# Patient Record
Sex: Male | Born: 1965 | Race: White | Marital: Married
Health system: Southern US, Community
[De-identification: ages and names within clinical notes are randomized; demographics above are authoritative.]

---

## 2013-09-26 ENCOUNTER — Ambulatory Visit (HOSPITAL_COMMUNITY)
Admission: RE | Admit: 2013-09-26 | Discharge: 2013-09-26 | Disposition: A | Discharge status: Home / Self Care | Payer: BC Managed Care – PPO | Source: Ambulatory Visit | Attending: Orthopedic Surgery | Admitting: Orthopedic Surgery

## 2013-09-26 ENCOUNTER — Other Ambulatory Visit (HOSPITAL_COMMUNITY): Payer: Self-pay | Admitting: Orthopedic Surgery

## 2013-09-26 DIAGNOSIS — Z139 Encounter for screening, unspecified: Secondary | ICD-10-CM

## 2013-09-26 DIAGNOSIS — Z01818 Encounter for other preprocedural examination: Secondary | ICD-10-CM | POA: Insufficient documentation

## 2014-02-02 ENCOUNTER — Encounter: Payer: Self-pay | Admitting: *Deleted

## 2014-02-02 NOTE — Telephone Encounter (Signed)
Opened in errror 

## 2015-10-02 DIAGNOSIS — R7309 Other abnormal glucose: Secondary | ICD-10-CM | POA: Diagnosis not present

## 2015-10-02 DIAGNOSIS — Z23 Encounter for immunization: Secondary | ICD-10-CM | POA: Diagnosis not present

## 2015-10-02 DIAGNOSIS — Z6828 Body mass index (BMI) 28.0-28.9, adult: Secondary | ICD-10-CM | POA: Diagnosis not present

## 2015-10-02 DIAGNOSIS — E291 Testicular hypofunction: Secondary | ICD-10-CM | POA: Diagnosis not present

## 2015-10-02 DIAGNOSIS — Z1389 Encounter for screening for other disorder: Secondary | ICD-10-CM | POA: Diagnosis not present

## 2015-10-02 DIAGNOSIS — R221 Localized swelling, mass and lump, neck: Secondary | ICD-10-CM | POA: Diagnosis not present

## 2015-10-17 DIAGNOSIS — D170 Benign lipomatous neoplasm of skin and subcutaneous tissue of head, face and neck: Secondary | ICD-10-CM | POA: Diagnosis not present

## 2016-02-28 DIAGNOSIS — Z6827 Body mass index (BMI) 27.0-27.9, adult: Secondary | ICD-10-CM | POA: Diagnosis not present

## 2016-02-28 DIAGNOSIS — Z1389 Encounter for screening for other disorder: Secondary | ICD-10-CM | POA: Diagnosis not present

## 2016-02-28 DIAGNOSIS — E291 Testicular hypofunction: Secondary | ICD-10-CM | POA: Diagnosis not present

## 2016-02-28 DIAGNOSIS — Z0000 Encounter for general adult medical examination without abnormal findings: Secondary | ICD-10-CM | POA: Diagnosis not present

## 2016-02-28 DIAGNOSIS — R7309 Other abnormal glucose: Secondary | ICD-10-CM | POA: Diagnosis not present

## 2016-06-11 DIAGNOSIS — H5203 Hypermetropia, bilateral: Secondary | ICD-10-CM | POA: Diagnosis not present

## 2016-08-20 DIAGNOSIS — R351 Nocturia: Secondary | ICD-10-CM | POA: Diagnosis not present

## 2016-08-20 DIAGNOSIS — R350 Frequency of micturition: Secondary | ICD-10-CM | POA: Diagnosis not present

## 2016-08-20 DIAGNOSIS — R3915 Urgency of urination: Secondary | ICD-10-CM | POA: Diagnosis not present

## 2016-08-20 DIAGNOSIS — R3129 Other microscopic hematuria: Secondary | ICD-10-CM | POA: Diagnosis not present

## 2016-08-20 DIAGNOSIS — Z87448 Personal history of other diseases of urinary system: Secondary | ICD-10-CM | POA: Diagnosis not present

## 2016-08-22 DIAGNOSIS — R3129 Other microscopic hematuria: Secondary | ICD-10-CM | POA: Diagnosis not present

## 2016-08-22 DIAGNOSIS — N401 Enlarged prostate with lower urinary tract symptoms: Secondary | ICD-10-CM | POA: Diagnosis not present

## 2016-08-22 DIAGNOSIS — R351 Nocturia: Secondary | ICD-10-CM | POA: Diagnosis not present

## 2016-08-22 DIAGNOSIS — N202 Calculus of kidney with calculus of ureter: Secondary | ICD-10-CM | POA: Diagnosis not present

## 2016-08-22 DIAGNOSIS — Z87438 Personal history of other diseases of male genital organs: Secondary | ICD-10-CM | POA: Diagnosis not present

## 2016-08-22 DIAGNOSIS — Z87442 Personal history of urinary calculi: Secondary | ICD-10-CM | POA: Diagnosis not present

## 2016-08-22 DIAGNOSIS — R3915 Urgency of urination: Secondary | ICD-10-CM | POA: Diagnosis not present

## 2016-08-22 DIAGNOSIS — Z87448 Personal history of other diseases of urinary system: Secondary | ICD-10-CM | POA: Diagnosis not present

## 2016-08-22 DIAGNOSIS — R350 Frequency of micturition: Secondary | ICD-10-CM | POA: Diagnosis not present

## 2016-08-22 DIAGNOSIS — N281 Cyst of kidney, acquired: Secondary | ICD-10-CM | POA: Diagnosis not present

## 2016-08-25 DIAGNOSIS — R300 Dysuria: Secondary | ICD-10-CM | POA: Diagnosis not present

## 2016-08-25 DIAGNOSIS — R3915 Urgency of urination: Secondary | ICD-10-CM | POA: Diagnosis not present

## 2016-08-25 DIAGNOSIS — N201 Calculus of ureter: Secondary | ICD-10-CM | POA: Diagnosis not present

## 2016-08-25 DIAGNOSIS — R3911 Hesitancy of micturition: Secondary | ICD-10-CM | POA: Diagnosis not present

## 2016-09-04 DIAGNOSIS — N401 Enlarged prostate with lower urinary tract symptoms: Secondary | ICD-10-CM | POA: Diagnosis not present

## 2016-09-04 DIAGNOSIS — N200 Calculus of kidney: Secondary | ICD-10-CM | POA: Diagnosis not present

## 2016-09-04 DIAGNOSIS — N201 Calculus of ureter: Secondary | ICD-10-CM | POA: Diagnosis not present

## 2016-10-29 DIAGNOSIS — N201 Calculus of ureter: Secondary | ICD-10-CM | POA: Diagnosis not present

## 2016-11-07 DIAGNOSIS — N2889 Other specified disorders of kidney and ureter: Secondary | ICD-10-CM | POA: Diagnosis not present

## 2016-11-07 DIAGNOSIS — N201 Calculus of ureter: Secondary | ICD-10-CM | POA: Diagnosis not present

## 2016-11-12 DIAGNOSIS — N200 Calculus of kidney: Secondary | ICD-10-CM | POA: Diagnosis not present

## 2016-11-12 DIAGNOSIS — N401 Enlarged prostate with lower urinary tract symptoms: Secondary | ICD-10-CM | POA: Diagnosis not present

## 2016-11-12 DIAGNOSIS — N201 Calculus of ureter: Secondary | ICD-10-CM | POA: Diagnosis not present

## 2016-11-13 DIAGNOSIS — N201 Calculus of ureter: Secondary | ICD-10-CM | POA: Diagnosis not present

## 2016-11-18 DIAGNOSIS — N201 Calculus of ureter: Secondary | ICD-10-CM | POA: Diagnosis not present

## 2016-11-18 DIAGNOSIS — N410 Acute prostatitis: Secondary | ICD-10-CM | POA: Diagnosis not present

## 2016-11-18 DIAGNOSIS — N419 Inflammatory disease of prostate, unspecified: Secondary | ICD-10-CM | POA: Diagnosis not present

## 2016-11-27 DIAGNOSIS — M9903 Segmental and somatic dysfunction of lumbar region: Secondary | ICD-10-CM | POA: Diagnosis not present

## 2016-11-27 DIAGNOSIS — M9904 Segmental and somatic dysfunction of sacral region: Secondary | ICD-10-CM | POA: Diagnosis not present

## 2016-11-27 DIAGNOSIS — M9905 Segmental and somatic dysfunction of pelvic region: Secondary | ICD-10-CM | POA: Diagnosis not present

## 2016-11-27 DIAGNOSIS — M5432 Sciatica, left side: Secondary | ICD-10-CM | POA: Diagnosis not present

## 2016-12-01 DIAGNOSIS — M5430 Sciatica, unspecified side: Secondary | ICD-10-CM | POA: Diagnosis not present

## 2016-12-02 DIAGNOSIS — N281 Cyst of kidney, acquired: Secondary | ICD-10-CM | POA: Diagnosis not present

## 2016-12-02 DIAGNOSIS — M5430 Sciatica, unspecified side: Secondary | ICD-10-CM | POA: Diagnosis not present

## 2016-12-02 DIAGNOSIS — M79605 Pain in left leg: Secondary | ICD-10-CM | POA: Diagnosis not present

## 2016-12-04 DIAGNOSIS — G5702 Lesion of sciatic nerve, left lower limb: Secondary | ICD-10-CM | POA: Diagnosis not present

## 2016-12-04 DIAGNOSIS — E663 Overweight: Secondary | ICD-10-CM | POA: Diagnosis not present

## 2016-12-04 DIAGNOSIS — Z6827 Body mass index (BMI) 27.0-27.9, adult: Secondary | ICD-10-CM | POA: Diagnosis not present

## 2016-12-16 DIAGNOSIS — M79672 Pain in left foot: Secondary | ICD-10-CM | POA: Diagnosis not present

## 2016-12-16 DIAGNOSIS — M5410 Radiculopathy, site unspecified: Secondary | ICD-10-CM | POA: Diagnosis not present

## 2016-12-16 DIAGNOSIS — M5432 Sciatica, left side: Secondary | ICD-10-CM | POA: Diagnosis not present

## 2016-12-16 DIAGNOSIS — M79609 Pain in unspecified limb: Secondary | ICD-10-CM | POA: Diagnosis not present

## 2016-12-18 DIAGNOSIS — M5416 Radiculopathy, lumbar region: Secondary | ICD-10-CM | POA: Diagnosis not present

## 2016-12-22 DIAGNOSIS — M5416 Radiculopathy, lumbar region: Secondary | ICD-10-CM | POA: Diagnosis not present

## 2016-12-29 DIAGNOSIS — M5417 Radiculopathy, lumbosacral region: Secondary | ICD-10-CM | POA: Diagnosis not present

## 2017-01-14 DIAGNOSIS — M5417 Radiculopathy, lumbosacral region: Secondary | ICD-10-CM | POA: Diagnosis not present

## 2017-01-20 DIAGNOSIS — M5417 Radiculopathy, lumbosacral region: Secondary | ICD-10-CM | POA: Diagnosis not present

## 2017-02-11 DIAGNOSIS — H5203 Hypermetropia, bilateral: Secondary | ICD-10-CM | POA: Diagnosis not present

## 2017-02-17 DIAGNOSIS — M5417 Radiculopathy, lumbosacral region: Secondary | ICD-10-CM | POA: Diagnosis not present

## 2017-02-19 DIAGNOSIS — M5417 Radiculopathy, lumbosacral region: Secondary | ICD-10-CM | POA: Diagnosis not present

## 2017-02-26 DIAGNOSIS — M5417 Radiculopathy, lumbosacral region: Secondary | ICD-10-CM | POA: Diagnosis not present

## 2017-03-03 DIAGNOSIS — M5417 Radiculopathy, lumbosacral region: Secondary | ICD-10-CM | POA: Diagnosis not present

## 2017-03-05 DIAGNOSIS — M5417 Radiculopathy, lumbosacral region: Secondary | ICD-10-CM | POA: Diagnosis not present

## 2017-03-09 DIAGNOSIS — L821 Other seborrheic keratosis: Secondary | ICD-10-CM | POA: Diagnosis not present

## 2017-03-10 DIAGNOSIS — M5417 Radiculopathy, lumbosacral region: Secondary | ICD-10-CM | POA: Diagnosis not present

## 2017-03-16 DIAGNOSIS — M5417 Radiculopathy, lumbosacral region: Secondary | ICD-10-CM | POA: Diagnosis not present

## 2017-03-19 DIAGNOSIS — M5417 Radiculopathy, lumbosacral region: Secondary | ICD-10-CM | POA: Diagnosis not present

## 2017-03-19 DIAGNOSIS — D170 Benign lipomatous neoplasm of skin and subcutaneous tissue of head, face and neck: Secondary | ICD-10-CM | POA: Diagnosis not present

## 2017-03-20 DIAGNOSIS — M5417 Radiculopathy, lumbosacral region: Secondary | ICD-10-CM | POA: Diagnosis not present

## 2017-03-23 DIAGNOSIS — M5417 Radiculopathy, lumbosacral region: Secondary | ICD-10-CM | POA: Diagnosis not present

## 2017-03-25 DIAGNOSIS — M5417 Radiculopathy, lumbosacral region: Secondary | ICD-10-CM | POA: Diagnosis not present

## 2017-03-27 DIAGNOSIS — M5417 Radiculopathy, lumbosacral region: Secondary | ICD-10-CM | POA: Diagnosis not present

## 2017-05-06 DIAGNOSIS — D170 Benign lipomatous neoplasm of skin and subcutaneous tissue of head, face and neck: Secondary | ICD-10-CM | POA: Diagnosis not present

## 2017-05-29 DIAGNOSIS — D170 Benign lipomatous neoplasm of skin and subcutaneous tissue of head, face and neck: Secondary | ICD-10-CM | POA: Diagnosis not present

## 2017-08-19 DIAGNOSIS — M5417 Radiculopathy, lumbosacral region: Secondary | ICD-10-CM | POA: Diagnosis not present

## 2017-12-02 DIAGNOSIS — G5700 Lesion of sciatic nerve, unspecified lower limb: Secondary | ICD-10-CM | POA: Diagnosis not present

## 2018-01-04 DIAGNOSIS — H16002 Unspecified corneal ulcer, left eye: Secondary | ICD-10-CM | POA: Diagnosis not present

## 2018-01-08 DIAGNOSIS — H16002 Unspecified corneal ulcer, left eye: Secondary | ICD-10-CM | POA: Diagnosis not present

## 2018-01-13 DIAGNOSIS — H16002 Unspecified corneal ulcer, left eye: Secondary | ICD-10-CM | POA: Diagnosis not present

## 2018-02-18 DIAGNOSIS — Z1389 Encounter for screening for other disorder: Secondary | ICD-10-CM | POA: Diagnosis not present

## 2018-02-18 DIAGNOSIS — Z0000 Encounter for general adult medical examination without abnormal findings: Secondary | ICD-10-CM | POA: Diagnosis not present

## 2018-02-18 DIAGNOSIS — N419 Inflammatory disease of prostate, unspecified: Secondary | ICD-10-CM | POA: Diagnosis not present

## 2018-02-18 DIAGNOSIS — E663 Overweight: Secondary | ICD-10-CM | POA: Diagnosis not present

## 2018-02-18 DIAGNOSIS — R7301 Impaired fasting glucose: Secondary | ICD-10-CM | POA: Diagnosis not present

## 2018-02-18 DIAGNOSIS — E291 Testicular hypofunction: Secondary | ICD-10-CM | POA: Diagnosis not present

## 2018-02-18 DIAGNOSIS — Z6827 Body mass index (BMI) 27.0-27.9, adult: Secondary | ICD-10-CM | POA: Diagnosis not present

## 2018-08-04 DIAGNOSIS — H524 Presbyopia: Secondary | ICD-10-CM | POA: Diagnosis not present

## 2019-08-11 DIAGNOSIS — Z6827 Body mass index (BMI) 27.0-27.9, adult: Secondary | ICD-10-CM | POA: Diagnosis not present

## 2019-08-11 DIAGNOSIS — Z0001 Encounter for general adult medical examination with abnormal findings: Secondary | ICD-10-CM | POA: Diagnosis not present

## 2019-08-11 DIAGNOSIS — R7301 Impaired fasting glucose: Secondary | ICD-10-CM | POA: Diagnosis not present

## 2019-08-11 DIAGNOSIS — E7849 Other hyperlipidemia: Secondary | ICD-10-CM | POA: Diagnosis not present

## 2019-08-11 DIAGNOSIS — N419 Inflammatory disease of prostate, unspecified: Secondary | ICD-10-CM | POA: Diagnosis not present

## 2019-08-11 DIAGNOSIS — E291 Testicular hypofunction: Secondary | ICD-10-CM | POA: Diagnosis not present

## 2019-08-11 DIAGNOSIS — Z1389 Encounter for screening for other disorder: Secondary | ICD-10-CM | POA: Diagnosis not present

## 2019-08-11 DIAGNOSIS — R001 Bradycardia, unspecified: Secondary | ICD-10-CM | POA: Diagnosis not present

## 2019-08-11 DIAGNOSIS — Z113 Encounter for screening for infections with a predominantly sexual mode of transmission: Secondary | ICD-10-CM | POA: Diagnosis not present

## 2019-08-11 DIAGNOSIS — N200 Calculus of kidney: Secondary | ICD-10-CM | POA: Diagnosis not present

## 2019-10-19 DIAGNOSIS — H524 Presbyopia: Secondary | ICD-10-CM | POA: Diagnosis not present

## 2019-12-14 ENCOUNTER — Other Ambulatory Visit: Payer: Self-pay | Admitting: Family Medicine

## 2019-12-14 DIAGNOSIS — N63 Unspecified lump in unspecified breast: Secondary | ICD-10-CM

## 2019-12-14 DIAGNOSIS — N6311 Unspecified lump in the right breast, upper outer quadrant: Secondary | ICD-10-CM | POA: Diagnosis not present

## 2019-12-14 DIAGNOSIS — Z681 Body mass index (BMI) 19 or less, adult: Secondary | ICD-10-CM | POA: Diagnosis not present

## 2019-12-16 ENCOUNTER — Ambulatory Visit
Admission: RE | Admit: 2019-12-16 | Discharge: 2019-12-16 | Disposition: A | Discharge status: Home / Self Care | Payer: BC Managed Care – PPO | Source: Ambulatory Visit | Attending: Family Medicine | Admitting: Family Medicine

## 2019-12-16 ENCOUNTER — Other Ambulatory Visit: Payer: Self-pay

## 2019-12-16 DIAGNOSIS — N62 Hypertrophy of breast: Secondary | ICD-10-CM | POA: Diagnosis not present

## 2019-12-16 DIAGNOSIS — N63 Unspecified lump in unspecified breast: Secondary | ICD-10-CM

## 2019-12-16 DIAGNOSIS — R928 Other abnormal and inconclusive findings on diagnostic imaging of breast: Secondary | ICD-10-CM | POA: Diagnosis not present

## 2020-02-08 DIAGNOSIS — R509 Fever, unspecified: Secondary | ICD-10-CM | POA: Diagnosis not present

## 2020-02-08 DIAGNOSIS — Z20822 Contact with and (suspected) exposure to covid-19: Secondary | ICD-10-CM | POA: Diagnosis not present

## 2020-02-08 DIAGNOSIS — R438 Other disturbances of smell and taste: Secondary | ICD-10-CM | POA: Diagnosis not present

## 2020-02-28 DIAGNOSIS — E7849 Other hyperlipidemia: Secondary | ICD-10-CM | POA: Diagnosis not present

## 2020-02-28 DIAGNOSIS — Z681 Body mass index (BMI) 19 or less, adult: Secondary | ICD-10-CM | POA: Diagnosis not present

## 2020-05-01 DIAGNOSIS — D171 Benign lipomatous neoplasm of skin and subcutaneous tissue of trunk: Secondary | ICD-10-CM | POA: Diagnosis not present

## 2020-06-01 DIAGNOSIS — D1722 Benign lipomatous neoplasm of skin and subcutaneous tissue of left arm: Secondary | ICD-10-CM | POA: Diagnosis not present

## 2020-06-01 DIAGNOSIS — D171 Benign lipomatous neoplasm of skin and subcutaneous tissue of trunk: Secondary | ICD-10-CM | POA: Diagnosis not present

## 2020-11-22 DIAGNOSIS — R7309 Other abnormal glucose: Secondary | ICD-10-CM | POA: Diagnosis not present

## 2020-11-22 DIAGNOSIS — E7849 Other hyperlipidemia: Secondary | ICD-10-CM | POA: Diagnosis not present

## 2020-11-22 DIAGNOSIS — Z1389 Encounter for screening for other disorder: Secondary | ICD-10-CM | POA: Diagnosis not present

## 2020-11-22 DIAGNOSIS — R7989 Other specified abnormal findings of blood chemistry: Secondary | ICD-10-CM | POA: Diagnosis not present

## 2020-11-22 DIAGNOSIS — N419 Inflammatory disease of prostate, unspecified: Secondary | ICD-10-CM | POA: Diagnosis not present

## 2020-11-22 DIAGNOSIS — E663 Overweight: Secondary | ICD-10-CM | POA: Diagnosis not present

## 2020-11-22 DIAGNOSIS — E291 Testicular hypofunction: Secondary | ICD-10-CM | POA: Diagnosis not present

## 2020-11-22 DIAGNOSIS — N200 Calculus of kidney: Secondary | ICD-10-CM | POA: Diagnosis not present

## 2020-11-22 DIAGNOSIS — Z0000 Encounter for general adult medical examination without abnormal findings: Secondary | ICD-10-CM | POA: Diagnosis not present

## 2020-11-22 DIAGNOSIS — Z6827 Body mass index (BMI) 27.0-27.9, adult: Secondary | ICD-10-CM | POA: Diagnosis not present

## 2020-11-22 DIAGNOSIS — R001 Bradycardia, unspecified: Secondary | ICD-10-CM | POA: Diagnosis not present

## 2020-11-22 DIAGNOSIS — Z1331 Encounter for screening for depression: Secondary | ICD-10-CM | POA: Diagnosis not present

## 2021-03-20 DIAGNOSIS — H524 Presbyopia: Secondary | ICD-10-CM | POA: Diagnosis not present

## 2022-01-01 DIAGNOSIS — M25512 Pain in left shoulder: Secondary | ICD-10-CM | POA: Diagnosis not present

## 2022-01-01 DIAGNOSIS — M25511 Pain in right shoulder: Secondary | ICD-10-CM | POA: Diagnosis not present

## 2022-02-13 DIAGNOSIS — Z6827 Body mass index (BMI) 27.0-27.9, adult: Secondary | ICD-10-CM | POA: Diagnosis not present

## 2022-02-13 DIAGNOSIS — N419 Inflammatory disease of prostate, unspecified: Secondary | ICD-10-CM | POA: Diagnosis not present

## 2022-02-13 DIAGNOSIS — N200 Calculus of kidney: Secondary | ICD-10-CM | POA: Diagnosis not present

## 2022-02-13 DIAGNOSIS — E663 Overweight: Secondary | ICD-10-CM | POA: Diagnosis not present

## 2022-02-13 DIAGNOSIS — E291 Testicular hypofunction: Secondary | ICD-10-CM | POA: Diagnosis not present

## 2022-02-13 DIAGNOSIS — E7849 Other hyperlipidemia: Secondary | ICD-10-CM | POA: Diagnosis not present

## 2022-02-13 DIAGNOSIS — Z1331 Encounter for screening for depression: Secondary | ICD-10-CM | POA: Diagnosis not present

## 2022-02-13 DIAGNOSIS — Z0001 Encounter for general adult medical examination with abnormal findings: Secondary | ICD-10-CM | POA: Diagnosis not present

## 2022-02-13 DIAGNOSIS — R001 Bradycardia, unspecified: Secondary | ICD-10-CM | POA: Diagnosis not present

## 2022-02-13 DIAGNOSIS — E119 Type 2 diabetes mellitus without complications: Secondary | ICD-10-CM | POA: Diagnosis not present

## 2022-02-13 DIAGNOSIS — E782 Mixed hyperlipidemia: Secondary | ICD-10-CM | POA: Diagnosis not present

## 2022-02-13 DIAGNOSIS — R7309 Other abnormal glucose: Secondary | ICD-10-CM | POA: Diagnosis not present

## 2022-04-09 DIAGNOSIS — M7542 Impingement syndrome of left shoulder: Secondary | ICD-10-CM | POA: Diagnosis not present

## 2022-05-19 DIAGNOSIS — K219 Gastro-esophageal reflux disease without esophagitis: Secondary | ICD-10-CM | POA: Diagnosis not present

## 2022-05-19 DIAGNOSIS — J101 Influenza due to other identified influenza virus with other respiratory manifestations: Secondary | ICD-10-CM | POA: Diagnosis not present

## 2022-06-12 DIAGNOSIS — R109 Unspecified abdominal pain: Secondary | ICD-10-CM | POA: Diagnosis not present

## 2022-06-12 DIAGNOSIS — N401 Enlarged prostate with lower urinary tract symptoms: Secondary | ICD-10-CM | POA: Diagnosis not present

## 2022-06-12 DIAGNOSIS — N138 Other obstructive and reflux uropathy: Secondary | ICD-10-CM | POA: Diagnosis not present

## 2022-06-12 DIAGNOSIS — N200 Calculus of kidney: Secondary | ICD-10-CM | POA: Diagnosis not present

## 2022-06-12 DIAGNOSIS — N50819 Testicular pain, unspecified: Secondary | ICD-10-CM | POA: Diagnosis not present

## 2022-06-12 IMAGING — MG DIGITAL DIAGNOSTIC BILAT W/ TOMO W/ CAD
6 of 10 series · 6 of 30 positions shown · non-contrast
Comparison: None.

ACR Breast Density Category a: The breast tissue is almost entirely
fatty.

CLINICAL DATA: Patient presents for a bilateral diagnostic
examination to evaluate a tender palpable area over the right
periareolar region 2 days. Patient notes recent use of medication
for prostatic hypertrophy as well as recent testosterone injections.

EXAM:
DIGITAL DIAGNOSTIC bilateral MAMMOGRAM WITH CAD AND TOMO
ULTRASOUND right BREAST

[R CC synth-2D]
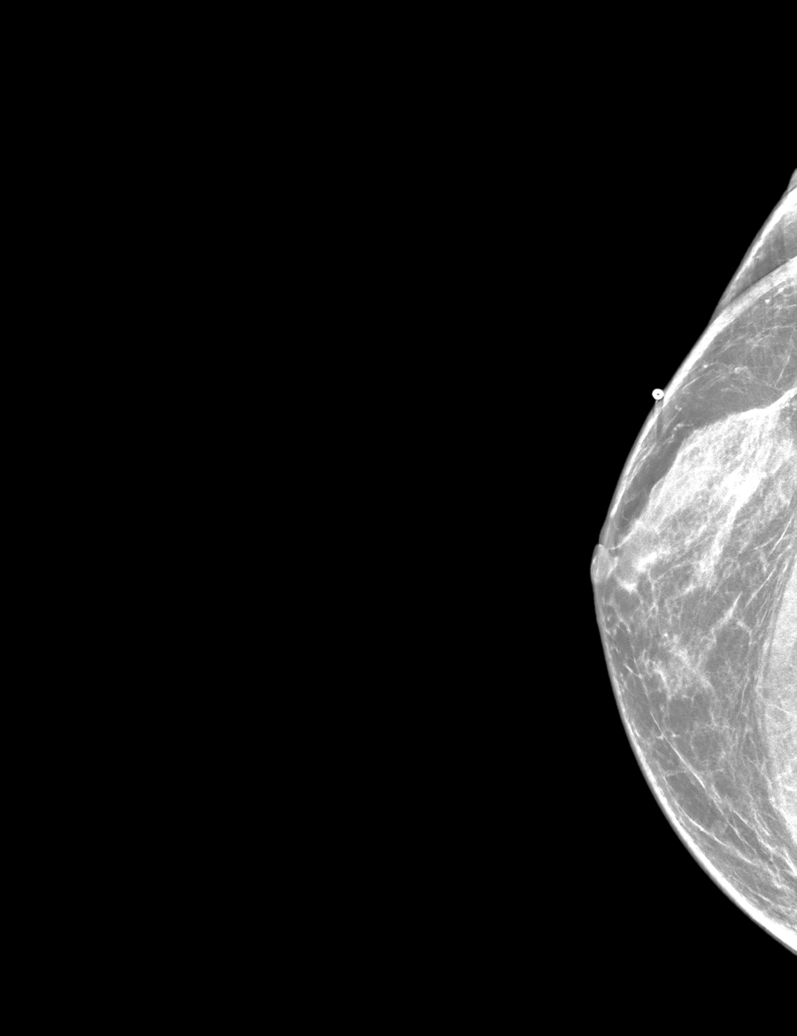

[R MLO synth-2D]
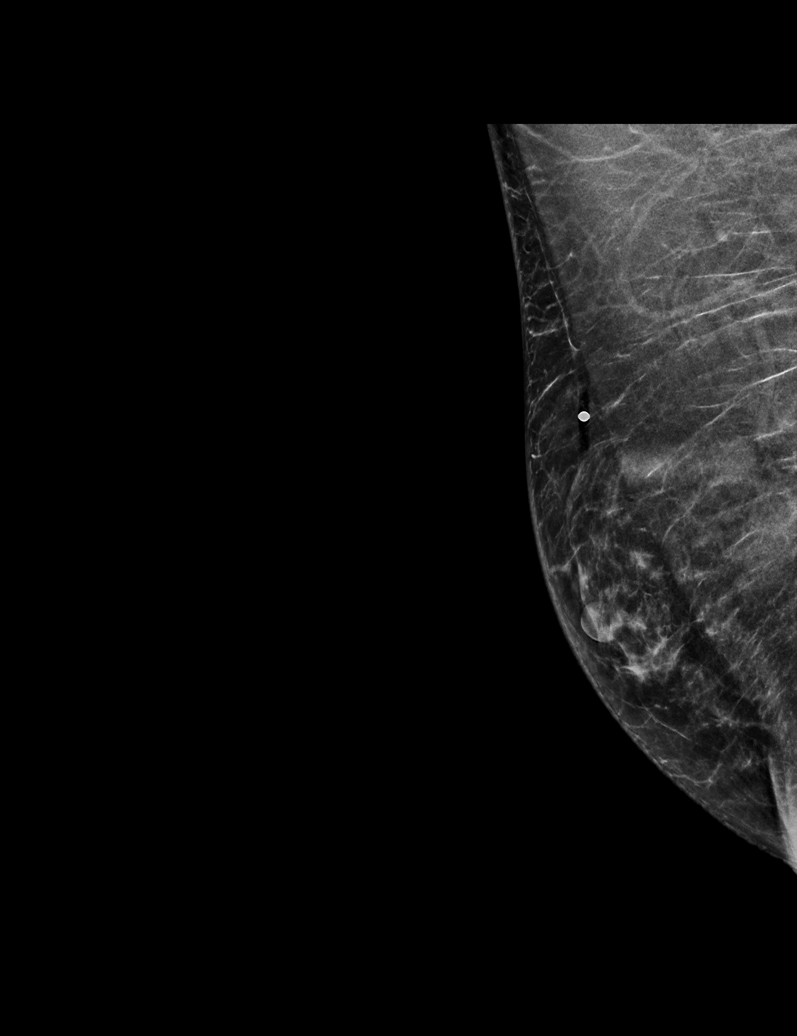

[L CC synth-2D]
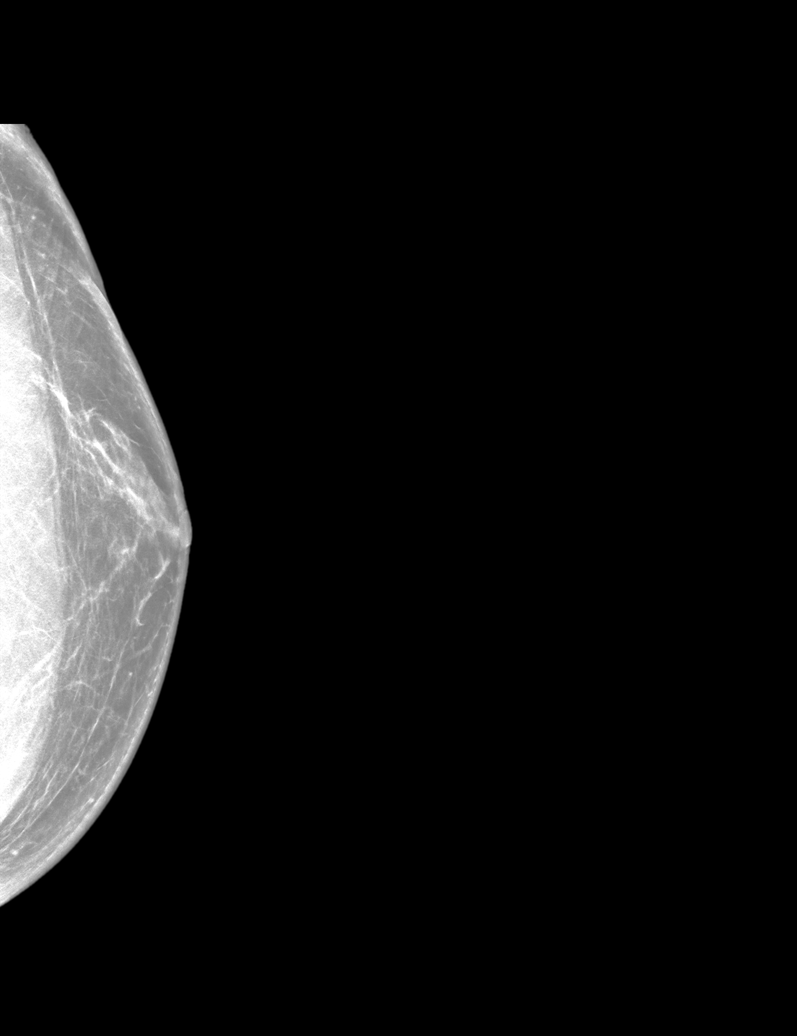

[L MLO synth-2D]
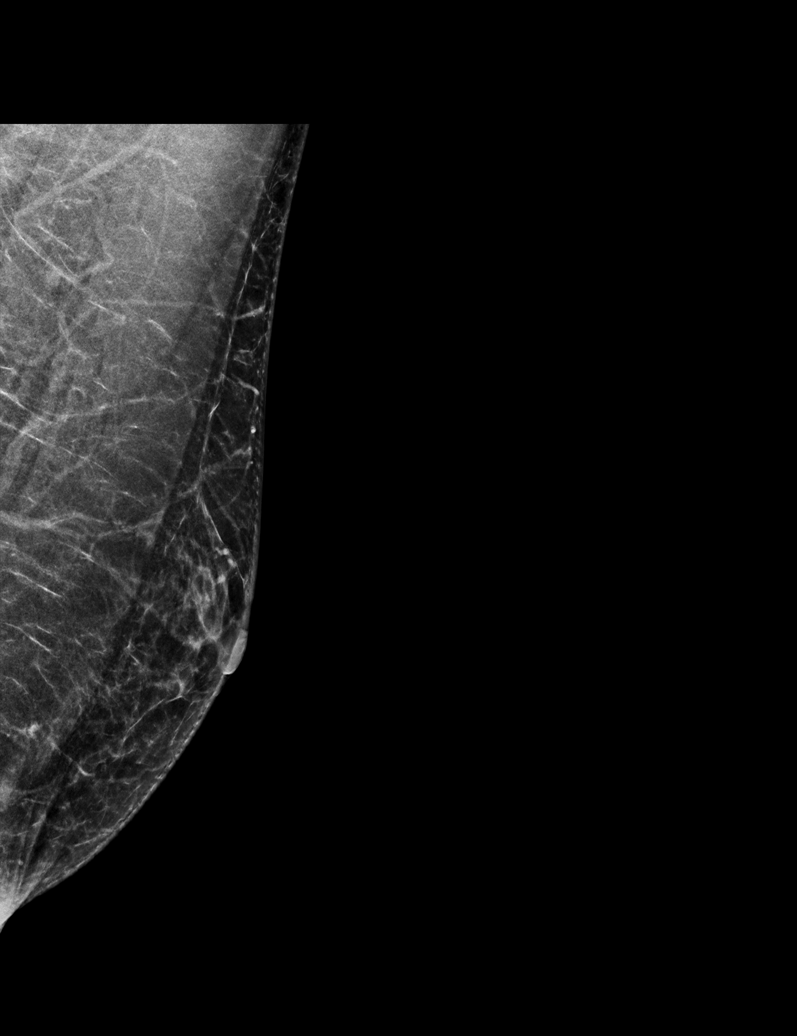

[R TAN synth-2D]
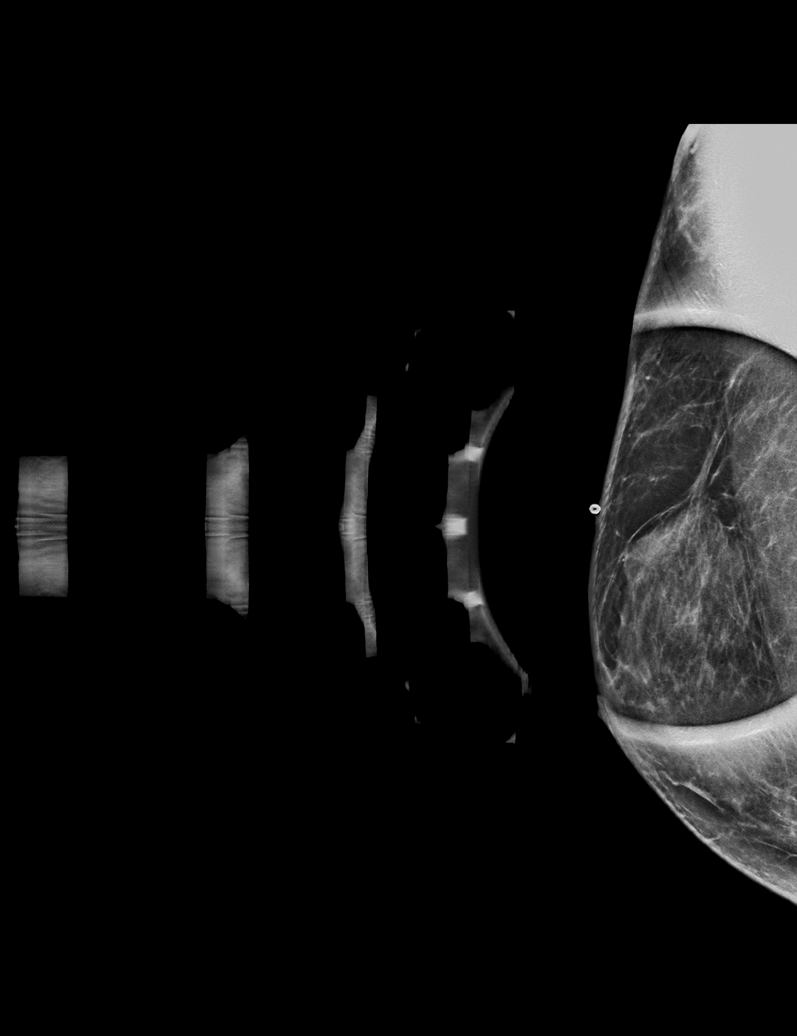

[R MLO tomo · tomo slice 27/53.0]
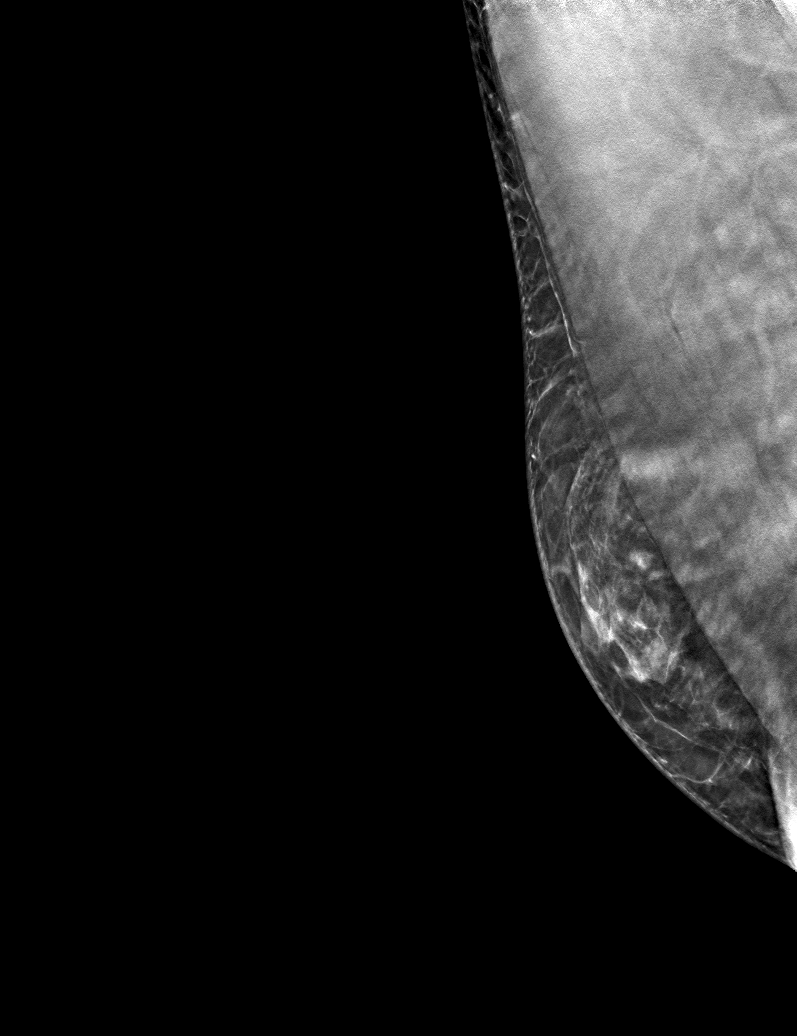

[6 of 30 positions shown; findings below may reference images not displayed]

FINDINGS: Examination demonstrates asymmetric fibrofatty density in the
bilateral retroareolar regions right worse than left with extension
of the fibrofatty density on the right side into the upper outer and
upper central breast likely gynecomastia. There is no focal
suspicious mass, distortion or suspicious microcalcifications. No
axillary adenopathy

Mammographic images were processed with CAD.

On physical exam, there is soft tender fullness over the right
retroareolar region and outer slightly upper periareolar region.
Right nipple areolar complex is normal.

Targeted ultrasound is performed, showing a pattern of dense
fibroglandular tissue over the upper outer right periareolar region
extending to the retroareolar region. This correlates to patient's
palpable abnormality and mammographic findings and is compatible
with gynecomastia.
IMPRESSION: Findings compatible with bilateral gynecomastia which is moderate on
the right and very mild on the left.

RECOMMENDATION:
Recommend follow-up of patient's gynecomastia on a clinical basis.
Recommend clinical correlation as to the possible etiology of this
gynecomastia which may be related to patient's recent use of
testosterone or prostate medication side effects.

I have discussed the findings and recommendations with the patient.
If applicable, a reminder letter will be sent to the patient
regarding the next appointment.

BI-RADS CATEGORY  2: Benign.

## 2022-06-25 DIAGNOSIS — N401 Enlarged prostate with lower urinary tract symptoms: Secondary | ICD-10-CM | POA: Diagnosis not present

## 2022-06-25 DIAGNOSIS — N138 Other obstructive and reflux uropathy: Secondary | ICD-10-CM | POA: Diagnosis not present

## 2022-06-25 DIAGNOSIS — N200 Calculus of kidney: Secondary | ICD-10-CM | POA: Diagnosis not present

## 2022-08-05 DIAGNOSIS — N200 Calculus of kidney: Secondary | ICD-10-CM | POA: Diagnosis not present

## 2022-08-05 DIAGNOSIS — R319 Hematuria, unspecified: Secondary | ICD-10-CM | POA: Diagnosis not present

## 2022-08-05 DIAGNOSIS — N401 Enlarged prostate with lower urinary tract symptoms: Secondary | ICD-10-CM | POA: Diagnosis not present

## 2022-08-05 DIAGNOSIS — R3912 Poor urinary stream: Secondary | ICD-10-CM | POA: Diagnosis not present

## 2022-08-05 DIAGNOSIS — N3289 Other specified disorders of bladder: Secondary | ICD-10-CM | POA: Diagnosis not present

## 2022-08-05 DIAGNOSIS — Z79899 Other long term (current) drug therapy: Secondary | ICD-10-CM | POA: Diagnosis not present

## 2022-08-05 DIAGNOSIS — N138 Other obstructive and reflux uropathy: Secondary | ICD-10-CM | POA: Diagnosis not present

## 2022-08-06 DIAGNOSIS — N138 Other obstructive and reflux uropathy: Secondary | ICD-10-CM | POA: Diagnosis not present

## 2022-08-06 DIAGNOSIS — N200 Calculus of kidney: Secondary | ICD-10-CM | POA: Diagnosis not present

## 2022-08-06 DIAGNOSIS — R319 Hematuria, unspecified: Secondary | ICD-10-CM | POA: Diagnosis not present

## 2022-08-06 DIAGNOSIS — Z79899 Other long term (current) drug therapy: Secondary | ICD-10-CM | POA: Diagnosis not present

## 2022-08-06 DIAGNOSIS — N3289 Other specified disorders of bladder: Secondary | ICD-10-CM | POA: Diagnosis not present

## 2022-08-06 DIAGNOSIS — N401 Enlarged prostate with lower urinary tract symptoms: Secondary | ICD-10-CM | POA: Diagnosis not present

## 2022-08-11 DIAGNOSIS — N401 Enlarged prostate with lower urinary tract symptoms: Secondary | ICD-10-CM | POA: Diagnosis not present

## 2022-08-11 DIAGNOSIS — N138 Other obstructive and reflux uropathy: Secondary | ICD-10-CM | POA: Diagnosis not present

## 2022-08-11 DIAGNOSIS — N200 Calculus of kidney: Secondary | ICD-10-CM | POA: Diagnosis not present

## 2022-08-25 DIAGNOSIS — N200 Calculus of kidney: Secondary | ICD-10-CM | POA: Diagnosis not present

## 2023-04-29 DIAGNOSIS — N200 Calculus of kidney: Secondary | ICD-10-CM | POA: Diagnosis not present

## 2023-04-29 DIAGNOSIS — R109 Unspecified abdominal pain: Secondary | ICD-10-CM | POA: Diagnosis not present

## 2023-07-15 ENCOUNTER — Other Ambulatory Visit: Payer: Self-pay | Admitting: Family Medicine

## 2023-07-15 DIAGNOSIS — E663 Overweight: Secondary | ICD-10-CM | POA: Diagnosis not present

## 2023-07-15 DIAGNOSIS — R7989 Other specified abnormal findings of blood chemistry: Secondary | ICD-10-CM | POA: Diagnosis not present

## 2023-07-15 DIAGNOSIS — Z1331 Encounter for screening for depression: Secondary | ICD-10-CM | POA: Diagnosis not present

## 2023-07-15 DIAGNOSIS — Z0001 Encounter for general adult medical examination with abnormal findings: Secondary | ICD-10-CM | POA: Diagnosis not present

## 2023-07-15 DIAGNOSIS — E7849 Other hyperlipidemia: Secondary | ICD-10-CM | POA: Diagnosis not present

## 2023-07-15 DIAGNOSIS — N419 Inflammatory disease of prostate, unspecified: Secondary | ICD-10-CM | POA: Diagnosis not present

## 2023-07-15 DIAGNOSIS — E782 Mixed hyperlipidemia: Secondary | ICD-10-CM | POA: Diagnosis not present

## 2023-07-15 DIAGNOSIS — Z6828 Body mass index (BMI) 28.0-28.9, adult: Secondary | ICD-10-CM | POA: Diagnosis not present

## 2023-07-15 DIAGNOSIS — R7309 Other abnormal glucose: Secondary | ICD-10-CM | POA: Diagnosis not present

## 2023-07-15 DIAGNOSIS — Z0000 Encounter for general adult medical examination without abnormal findings: Secondary | ICD-10-CM | POA: Diagnosis not present

## 2023-07-15 DIAGNOSIS — E291 Testicular hypofunction: Secondary | ICD-10-CM | POA: Diagnosis not present

## 2023-07-15 DIAGNOSIS — R001 Bradycardia, unspecified: Secondary | ICD-10-CM | POA: Diagnosis not present

## 2023-08-12 ENCOUNTER — Ambulatory Visit
Admission: RE | Admit: 2023-08-12 | Discharge: 2023-08-12 | Disposition: A | Discharge status: Home / Self Care | Payer: No Typology Code available for payment source | Source: Ambulatory Visit | Attending: Family Medicine | Admitting: Family Medicine

## 2023-08-12 DIAGNOSIS — Z0001 Encounter for general adult medical examination with abnormal findings: Secondary | ICD-10-CM

## 2023-10-13 DIAGNOSIS — M25511 Pain in right shoulder: Secondary | ICD-10-CM | POA: Diagnosis not present

## 2023-10-13 DIAGNOSIS — M7541 Impingement syndrome of right shoulder: Secondary | ICD-10-CM | POA: Diagnosis not present

## 2023-10-13 DIAGNOSIS — M25512 Pain in left shoulder: Secondary | ICD-10-CM | POA: Diagnosis not present

## 2023-10-13 DIAGNOSIS — M7542 Impingement syndrome of left shoulder: Secondary | ICD-10-CM | POA: Diagnosis not present

## 2023-10-20 DIAGNOSIS — H524 Presbyopia: Secondary | ICD-10-CM | POA: Diagnosis not present

## 2024-01-06 DIAGNOSIS — M7541 Impingement syndrome of right shoulder: Secondary | ICD-10-CM | POA: Diagnosis not present

## 2024-01-18 DIAGNOSIS — M7541 Impingement syndrome of right shoulder: Secondary | ICD-10-CM | POA: Diagnosis not present
